# Patient Record
Sex: Male | Born: 1940 | Race: Black or African American | Hispanic: No | Marital: Single
Health system: Southern US, Community
[De-identification: ages and names within clinical notes are randomized; demographics above are authoritative.]

---

## 2017-04-18 ENCOUNTER — Inpatient Hospital Stay
Admission: AC | Admit: 2017-04-18 | Discharge: 2017-05-06 | Disposition: A | Discharge status: Skilled Nursing Facility | Payer: Self-pay | Source: Other Acute Inpatient Hospital | Attending: Internal Medicine | Admitting: Internal Medicine

## 2017-04-18 DIAGNOSIS — J90 Pleural effusion, not elsewhere classified: Secondary | ICD-10-CM

## 2017-04-18 DIAGNOSIS — J9690 Respiratory failure, unspecified, unspecified whether with hypoxia or hypercapnia: Secondary | ICD-10-CM

## 2017-04-18 DIAGNOSIS — Z9889 Other specified postprocedural states: Secondary | ICD-10-CM

## 2017-04-18 LAB — VANCOMYCIN, TROUGH: VANCOMYCIN TR: 23 ug/mL — AB (ref 15–20)

## 2017-04-19 ENCOUNTER — Other Ambulatory Visit (HOSPITAL_COMMUNITY): Payer: Self-pay

## 2017-04-19 LAB — CBC WITH DIFFERENTIAL/PLATELET
BASOS ABS: 0 10*3/uL (ref 0.0–0.1)
BASOS PCT: 0 %
Eosinophils Absolute: 0.3 10*3/uL (ref 0.0–0.7)
Eosinophils Relative: 3 %
HEMATOCRIT: 29.9 % — AB (ref 39.0–52.0)
HEMOGLOBIN: 9.4 g/dL — AB (ref 13.0–17.0)
LYMPHS PCT: 19 %
Lymphs Abs: 1.7 10*3/uL (ref 0.7–4.0)
MCH: 29.9 pg (ref 26.0–34.0)
MCHC: 31.4 g/dL (ref 30.0–36.0)
MCV: 95.2 fL (ref 78.0–100.0)
Monocytes Absolute: 1.2 10*3/uL — ABNORMAL HIGH (ref 0.1–1.0)
Monocytes Relative: 14 %
NEUTROS ABS: 5.7 10*3/uL (ref 1.7–7.7)
NEUTROS PCT: 64 %
Platelets: 326 10*3/uL (ref 150–400)
RBC: 3.14 MIL/uL — ABNORMAL LOW (ref 4.22–5.81)
RDW: 14.7 % (ref 11.5–15.5)
WBC: 8.9 10*3/uL (ref 4.0–10.5)

## 2017-04-19 LAB — COMPREHENSIVE METABOLIC PANEL
ALBUMIN: 1.5 g/dL — AB (ref 3.5–5.0)
ALT: 20 U/L (ref 17–63)
ANION GAP: 8 (ref 5–15)
AST: 47 U/L — ABNORMAL HIGH (ref 15–41)
Alkaline Phosphatase: 158 U/L — ABNORMAL HIGH (ref 38–126)
BILIRUBIN TOTAL: 1.1 mg/dL (ref 0.3–1.2)
BUN: 6 mg/dL (ref 6–20)
CALCIUM: 7.8 mg/dL — AB (ref 8.9–10.3)
CO2: 25 mmol/L (ref 22–32)
Chloride: 102 mmol/L (ref 101–111)
Creatinine, Ser: 0.86 mg/dL (ref 0.61–1.24)
GLUCOSE: 101 mg/dL — AB (ref 65–99)
POTASSIUM: 2.9 mmol/L — AB (ref 3.5–5.1)
Sodium: 135 mmol/L (ref 135–145)
TOTAL PROTEIN: 5.3 g/dL — AB (ref 6.5–8.1)

## 2017-04-19 LAB — POTASSIUM: Potassium: 3.8 mmol/L (ref 3.5–5.1)

## 2017-04-19 LAB — VANCOMYCIN, TROUGH: Vancomycin Tr: 11 ug/mL — ABNORMAL LOW (ref 15–20)

## 2017-04-19 LAB — PROTIME-INR
INR: 1.18
Prothrombin Time: 14.9 seconds (ref 11.4–15.2)

## 2017-04-22 LAB — VANCOMYCIN, TROUGH: VANCOMYCIN TR: 17 ug/mL (ref 15–20)

## 2017-04-23 LAB — CBC
HCT: 33.8 % — ABNORMAL LOW (ref 39.0–52.0)
HEMOGLOBIN: 10.8 g/dL — AB (ref 13.0–17.0)
MCH: 30.5 pg (ref 26.0–34.0)
MCHC: 32 g/dL (ref 30.0–36.0)
MCV: 95.5 fL (ref 78.0–100.0)
Platelets: 312 10*3/uL (ref 150–400)
RBC: 3.54 MIL/uL — ABNORMAL LOW (ref 4.22–5.81)
RDW: 14.6 % (ref 11.5–15.5)
WBC: 11.2 10*3/uL — ABNORMAL HIGH (ref 4.0–10.5)

## 2017-04-23 LAB — BASIC METABOLIC PANEL
ANION GAP: 11 (ref 5–15)
BUN: 7 mg/dL (ref 6–20)
CHLORIDE: 103 mmol/L (ref 101–111)
CO2: 24 mmol/L (ref 22–32)
Calcium: 8.1 mg/dL — ABNORMAL LOW (ref 8.9–10.3)
Creatinine, Ser: 0.97 mg/dL (ref 0.61–1.24)
GFR calc Af Amer: >60 mL/min (ref >60)
GLUCOSE: 94 mg/dL (ref 65–99)
POTASSIUM: 3.2 mmol/L — AB (ref 3.5–5.1)
Sodium: 138 mmol/L (ref 135–145)

## 2017-04-24 LAB — POTASSIUM: Potassium: 5.1 mmol/L (ref 3.5–5.1)

## 2017-04-25 LAB — VANCOMYCIN, TROUGH: Vancomycin Tr: 16 ug/mL (ref 15–20)

## 2017-04-25 LAB — POTASSIUM: Potassium: 3.1 mmol/L — ABNORMAL LOW (ref 3.5–5.1)

## 2017-04-26 LAB — COMPREHENSIVE METABOLIC PANEL
ALT: 14 U/L — ABNORMAL LOW (ref 17–63)
ANION GAP: 9 (ref 5–15)
AST: 30 U/L (ref 15–41)
Albumin: 2 g/dL — ABNORMAL LOW (ref 3.5–5.0)
Alkaline Phosphatase: 121 U/L (ref 38–126)
BILIRUBIN TOTAL: 1.2 mg/dL (ref 0.3–1.2)
BUN: 9 mg/dL (ref 6–20)
CHLORIDE: 104 mmol/L (ref 101–111)
CO2: 23 mmol/L (ref 22–32)
Calcium: 8.5 mg/dL — ABNORMAL LOW (ref 8.9–10.3)
Creatinine, Ser: 0.87 mg/dL (ref 0.61–1.24)
Glucose, Bld: 56 mg/dL — ABNORMAL LOW (ref 65–99)
POTASSIUM: 3.1 mmol/L — AB (ref 3.5–5.1)
Sodium: 136 mmol/L (ref 135–145)
Total Protein: 5.9 g/dL — ABNORMAL LOW (ref 6.5–8.1)

## 2017-04-26 LAB — CBC
HEMATOCRIT: 35.2 % — AB (ref 39.0–52.0)
Hemoglobin: 11.3 g/dL — ABNORMAL LOW (ref 13.0–17.0)
MCH: 30.6 pg (ref 26.0–34.0)
MCHC: 32.1 g/dL (ref 30.0–36.0)
MCV: 95.4 fL (ref 78.0–100.0)
PLATELETS: 323 10*3/uL (ref 150–400)
RBC: 3.69 MIL/uL — ABNORMAL LOW (ref 4.22–5.81)
RDW: 14.6 % (ref 11.5–15.5)
WBC: 10.5 10*3/uL (ref 4.0–10.5)

## 2017-04-26 LAB — MAGNESIUM: MAGNESIUM: 1.5 mg/dL — AB (ref 1.7–2.4)

## 2017-04-26 LAB — PHOSPHORUS: PHOSPHORUS: 3.6 mg/dL (ref 2.5–4.6)

## 2017-04-27 ENCOUNTER — Encounter: Payer: Self-pay | Admitting: *Deleted

## 2017-04-27 ENCOUNTER — Other Ambulatory Visit (HOSPITAL_COMMUNITY): Payer: Self-pay

## 2017-04-27 LAB — BASIC METABOLIC PANEL
Anion gap: 12 (ref 5–15)
BUN: 15 mg/dL (ref 6–20)
CO2: 21 mmol/L — ABNORMAL LOW (ref 22–32)
CREATININE: 1.2 mg/dL (ref 0.61–1.24)
Calcium: 8.7 mg/dL — ABNORMAL LOW (ref 8.9–10.3)
Chloride: 104 mmol/L (ref 101–111)
GFR calc Af Amer: >60 mL/min (ref >60)
GFR, EST NON AFRICAN AMERICAN: 58 mL/min — AB (ref >60)
GLUCOSE: 87 mg/dL (ref 65–99)
POTASSIUM: 3.7 mmol/L (ref 3.5–5.1)
SODIUM: 137 mmol/L (ref 135–145)

## 2017-04-27 LAB — MAGNESIUM: Magnesium: 1.9 mg/dL (ref 1.7–2.4)

## 2017-04-27 LAB — PHOSPHORUS: Phosphorus: 3.8 mg/dL (ref 2.5–4.6)

## 2017-04-28 ENCOUNTER — Other Ambulatory Visit (HOSPITAL_COMMUNITY): Payer: Self-pay

## 2017-04-28 LAB — GLUCOSE, PLEURAL OR PERITONEAL FLUID: Glucose, Fluid: 79 mg/dL

## 2017-04-28 LAB — GRAM STAIN

## 2017-04-28 MED ORDER — LIDOCAINE HCL (PF) 1 % IJ SOLN
INTRAMUSCULAR | Status: AC
  Filled 2017-04-28: qty 30

## 2017-04-28 NOTE — Procedures (Signed)
Ultrasound-guided diagnostic and therapeutic left thoracentesis performed yielding 0.4 liters of serous colored fluid. No immediate complications. Follow-up chest x-ray pending.      Letha CapeKelly E Chauncey Bruno 10:23 AM 04/28/2017

## 2017-04-28 NOTE — Progress Notes (Signed)
Referring Physician(s): Dr. Carron Curie  Supervising Physician: Malachy Moan  Patient Status: [facility]  Chief Complaint: Lung abscess drains placed at outside hospital   Subjective: This is a 77 yo male who was transferred to [facility] after being hospitalized at an outside facility for a lung abscess.  He had two pigtail drains placed.  I don't know any further history.  He is currently now here in [facility] and a CT of the chest yesterday was completed.  A request was made for possible drain manipulations.  Allergies: Patient has no allergy information on record.  Medications: Prior to Admission medications   Not on File    Vital Signs: BP (!) 122/58 (BP Location: Left Arm)   Physical Exam: Chest: chest drains in place with no output for the last 3 days.  These drains are hooked up to an "express" system.  Chest drains removed with no issues and site covered with clean dressing.  Imaging: Ct Chest Wo Contrast  Result Date: 04/27/2017 CLINICAL DATA:  Follow-up lung abscess drainage. EXAM: CT CHEST WITHOUT CONTRAST TECHNIQUE: Multidetector CT imaging of the chest was performed following the standard protocol without IV contrast. COMPARISON:  No prior cross-sectional imaging available for comparison. FINDINGS: Cardiovascular: There is moderate cardiac enlargement. Aortic atherosclerosis. Calcification within the LAD and left circumflex and RCA coronary arteries noted. No pericardial effusion. Mediastinum/Nodes: The trachea appears patent and midline. Unremarkable appearance of the esophagus. AP window lymph node measures 8 mm. Lungs/Pleura: A moderate volume left pleural effusion is identified. Mild to moderate changes of emphysema. Left upper lobe cavitary lung mass containing 2 percutaneous pigtail drainage catheters measures 5.1 by 6.4 by 7.5 cm (volume = 130 cm^3). Scattered inflammatory nodules are identified throughout the left lower lobe. Patchy airspace  consolidation within the right lung base noted. Upper Abdomen: Within the limitations of unenhanced technique no acute findings identified within the visualized portions of the upper abdomen. 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 No aggressive lytic or sclerotic bone lesions identified. IMPRESSION: 1. Cavitary lung mass within the left upper lobe compatible with abscess has a volume of approximately 130 cc and contains 2 percutaneous pigtail drainage catheters. 2. Moderate left pleural effusion. 3. Patchy airspace disease noted within the posterior right lung base. 4. Aortic atherosclerosis and 3 vessel coronary artery calcifications. 5.  Emphysema (ICD10-J43.9). Electronically Signed   By: Signa Kell M.D.   On: 04/27/2017 12:34   Dg Chest Port 1 View  Result Date: 04/28/2017 CLINICAL DATA:  77 year old male with cavitary left lung lesion status post ultrasound-guided left side thoracentesis this morning. EXAM: PORTABLE CHEST 1 VIEW COMPARISON:  Ultrasound thoracentesis images 0943 hr today. Chest CT without contrast 04/27/2017, and earlier. FINDINGS: Portable AP semi upright view at 1021 hrs. Percutaneous pigtail left chest tubes have been removed. Improved lower left lung ventilation status post thoracentesis. No pneumothorax identified. Continued mixed density and cavitary area about the left hilum. Continued left lung volume loss. Residual or loculated small volume pleural fluid in the left apex. The right lung remains clear. Stable left PICC line. Visualized tracheal air column is within normal limits. IMPRESSION: 1. Improved left lung ventilation status post thoracentesis with no pneumothorax. Percutaneous left chest pigtail drains removed. 2. Persistent cavitary lesion about the left hilum and left lung volume loss. Small volume persistent or loculated left apical pleural fluid. 3. No new cardiopulmonary abnormality. Electronically Signed   By: Odessa Fleming M.D.   On:  04/28/2017 10:47   [place] Thoracentesis Asp Pleural Space W/img Guide  Result Date: 04/28/2017 INDICATION: History of left lung abscess and bronchopleural fistula. New imaging reveals a small left pleural effusion. Request is made for diagnostic and therapeutic thoracentesis. EXAM: ULTRASOUND GUIDED DIAGNOSTIC AND THERAPEUTIC THORACENTESIS MEDICATIONS: 1% lidocaine COMPLICATIONS: None immediate. PROCEDURE: An ultrasound guided thoracentesis was thoroughly discussed with the patient and questions answered. The benefits, risks, alternatives and complications were also discussed. The patient understands and wishes to proceed with the procedure. Written consent was obtained. Ultrasound was performed to localize and mark an adequate pocket of fluid in the left chest. The area was then prepped and draped in the normal sterile fashion. 1% Lidocaine was used for local anesthesia. Under ultrasound guidance a Safe-T-Centesis catheter was introduced. Thoracentesis was performed. The catheter was removed and a dressing applied. FINDINGS: A total of approximately 0.4 L of serous fluid was removed. Samples were sent to the laboratory as requested by the clinical team. IMPRESSION: Successful ultrasound guided left thoracentesis yielding 0.4 L of pleural fluid. Read by: Barnetta ChapelKelly Roan Miklos, PA-C Electronically Signed   By: Malachy MoanHeath  McCullough M.D.   On: 04/28/2017 10:48    Labs:  CBC: Recent Labs    04/19/17 0352 04/23/17 0636 04/26/17 0600  WBC 8.9 11.2* 10.5  HGB 9.4* 10.8* 11.3*  HCT 29.9* 33.8* 35.2*  PLT 326 312 323    COAGS: Recent Labs    04/19/17 0352  INR 1.18    BMP: Recent Labs    04/19/17 0352  04/23/17 0636 04/24/17 0931 04/25/17 0641 04/26/17 0600 04/27/17 0743  NA 135  --  138  --   --  136 137  K 2.9*   < > 3.2* 5.1 3.1* 3.1* 3.7  CL 102  --  103  --   --  104 104  CO2 25  --  24  --   --  23 21*  GLUCOSE 101*  --  94  --   --  56* 87  BUN 6  --  7  --   --  9 15    CALCIUM 7.8*  --  8.1*  --   --  8.5* 8.7*  CREATININE 0.86  --  0.97  --   --  0.87 1.20  GFRNONAA >60  --  >60  --   --  >60 58*  GFRAA >60  --  >60  --   --  >60 >60   < > = values in this interval not displayed.    LIVER FUNCTION TESTS: Recent Labs    04/19/17 0352 04/26/17 0600  BILITOT 1.1 1.2  AST 47* 30  ALT 20 14*  ALKPHOS 158* 121  PROT 5.3* 5.9*  ALBUMIN 1.5* 2.0*    Assessment and Plan: 1. Lung abscess, s/p perc drain at outside hospital  Imaging reviewed with Dr. Archer AsaMcCullough.  There is no new abscess.  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  No repositioning of these drains is warranted.  Actually both drains can be pulled if no output and not hooked up to a [place] on suction.  Given these were not to suction and no output, they were removed.  This was thoroughly discussed with Merril Abbehun Li, NP in [facility] who desired these to be removed.  No further needs from IR service.   Electronically Signed: Letha CapeKelly E Richard Ritchey 04/28/2017, 11:37 AM   I spent a total of 25 Minutes at the the patient's bedside AND  on the patient's hospital floor or unit, greater than 50% of which was counseling/coordinating care for lung abscess

## 2017-04-29 ENCOUNTER — Other Ambulatory Visit (HOSPITAL_COMMUNITY): Payer: Self-pay

## 2017-04-29 LAB — CBC
HCT: 29.6 % — ABNORMAL LOW (ref 39.0–52.0)
HEMOGLOBIN: 9.6 g/dL — AB (ref 13.0–17.0)
MCH: 31.2 pg (ref 26.0–34.0)
MCHC: 32.4 g/dL (ref 30.0–36.0)
MCV: 96.1 fL (ref 78.0–100.0)
Platelets: 262 10*3/uL (ref 150–400)
RBC: 3.08 MIL/uL — AB (ref 4.22–5.81)
RDW: 15 % (ref 11.5–15.5)
WBC: 7.8 10*3/uL (ref 4.0–10.5)

## 2017-04-29 LAB — BASIC METABOLIC PANEL
Anion gap: 9 (ref 5–15)
BUN: 17 mg/dL (ref 6–20)
CHLORIDE: 105 mmol/L (ref 101–111)
CO2: 20 mmol/L — ABNORMAL LOW (ref 22–32)
Calcium: 8.4 mg/dL — ABNORMAL LOW (ref 8.9–10.3)
Creatinine, Ser: 1.34 mg/dL — ABNORMAL HIGH (ref 0.61–1.24)
GFR calc non Af Amer: 51 mL/min — ABNORMAL LOW (ref >60)
GFR, EST AFRICAN AMERICAN: 59 mL/min — AB (ref >60)
Glucose, Bld: 89 mg/dL (ref 65–99)
POTASSIUM: 3.3 mmol/L — AB (ref 3.5–5.1)
SODIUM: 134 mmol/L — AB (ref 135–145)

## 2017-04-29 LAB — MAGNESIUM: MAGNESIUM: 1.7 mg/dL (ref 1.7–2.4)

## 2017-04-29 LAB — PHOSPHORUS: PHOSPHORUS: 3.8 mg/dL (ref 2.5–4.6)

## 2017-04-30 ENCOUNTER — Other Ambulatory Visit (HOSPITAL_COMMUNITY): Payer: Self-pay

## 2017-04-30 LAB — POTASSIUM: Potassium: 3.4 mmol/L — ABNORMAL LOW (ref 3.5–5.1)

## 2017-05-01 ENCOUNTER — Other Ambulatory Visit (HOSPITAL_COMMUNITY): Payer: Self-pay

## 2017-05-01 LAB — CBC
HCT: 31 % — ABNORMAL LOW (ref 39.0–52.0)
HEMOGLOBIN: 9.8 g/dL — AB (ref 13.0–17.0)
MCH: 30 pg (ref 26.0–34.0)
MCHC: 31.6 g/dL (ref 30.0–36.0)
MCV: 94.8 fL (ref 78.0–100.0)
PLATELETS: 292 10*3/uL (ref 150–400)
RBC: 3.27 MIL/uL — ABNORMAL LOW (ref 4.22–5.81)
RDW: 14.5 % (ref 11.5–15.5)
WBC: 9.1 10*3/uL (ref 4.0–10.5)

## 2017-05-01 LAB — URINALYSIS, ROUTINE W REFLEX MICROSCOPIC
Bilirubin Urine: NEGATIVE
Glucose, UA: NEGATIVE mg/dL
KETONES UR: NEGATIVE mg/dL
Leukocytes, UA: NEGATIVE
NITRITE: POSITIVE — AB
PROTEIN: NEGATIVE mg/dL
Specific Gravity, Urine: 1.008 (ref 1.005–1.030)
pH: 8 (ref 5.0–8.0)

## 2017-05-01 LAB — EXPECTORATED SPUTUM ASSESSMENT W GRAM STAIN, RFLX TO RESP C

## 2017-05-01 LAB — BASIC METABOLIC PANEL
Anion gap: 8 (ref 5–15)
BUN: 20 mg/dL (ref 6–20)
CHLORIDE: 104 mmol/L (ref 101–111)
CO2: 21 mmol/L — ABNORMAL LOW (ref 22–32)
CREATININE: 1.36 mg/dL — AB (ref 0.61–1.24)
Calcium: 8.4 mg/dL — ABNORMAL LOW (ref 8.9–10.3)
GFR calc Af Amer: 58 mL/min — ABNORMAL LOW (ref >60)
GFR calc non Af Amer: 50 mL/min — ABNORMAL LOW (ref >60)
Glucose, Bld: 46 mg/dL — ABNORMAL LOW (ref 65–99)
Potassium: 3.6 mmol/L (ref 3.5–5.1)
SODIUM: 133 mmol/L — AB (ref 135–145)

## 2017-05-01 LAB — EXPECTORATED SPUTUM ASSESSMENT W REFEX TO RESP CULTURE

## 2017-05-01 LAB — VANCOMYCIN, TROUGH: Vancomycin Tr: 50 ug/mL (ref 15–20)

## 2017-05-01 LAB — MAGNESIUM: MAGNESIUM: 1.7 mg/dL (ref 1.7–2.4)

## 2017-05-01 LAB — PHOSPHORUS: Phosphorus: 3.9 mg/dL (ref 2.5–4.6)

## 2017-05-02 LAB — CBC
HEMATOCRIT: 33.9 % — AB (ref 39.0–52.0)
Hemoglobin: 11 g/dL — ABNORMAL LOW (ref 13.0–17.0)
MCH: 31.2 pg (ref 26.0–34.0)
MCHC: 32.4 g/dL (ref 30.0–36.0)
MCV: 96 fL (ref 78.0–100.0)
Platelets: 306 10*3/uL (ref 150–400)
RBC: 3.53 MIL/uL — AB (ref 4.22–5.81)
RDW: 15 % (ref 11.5–15.5)
WBC: 9.6 10*3/uL (ref 4.0–10.5)

## 2017-05-02 LAB — MAGNESIUM: MAGNESIUM: 1.7 mg/dL (ref 1.7–2.4)

## 2017-05-02 LAB — RENAL FUNCTION PANEL
Albumin: 2.2 g/dL — ABNORMAL LOW (ref 3.5–5.0)
Anion gap: 11 (ref 5–15)
BUN: 18 mg/dL (ref 6–20)
CO2: 22 mmol/L (ref 22–32)
Calcium: 8.8 mg/dL — ABNORMAL LOW (ref 8.9–10.3)
Chloride: 102 mmol/L (ref 101–111)
Creatinine, Ser: 1.4 mg/dL — ABNORMAL HIGH (ref 0.61–1.24)
GFR, EST AFRICAN AMERICAN: 56 mL/min — AB (ref >60)
GFR, EST NON AFRICAN AMERICAN: 48 mL/min — AB (ref >60)
Glucose, Bld: 73 mg/dL (ref 65–99)
PHOSPHORUS: 3.7 mg/dL (ref 2.5–4.6)
POTASSIUM: 3.3 mmol/L — AB (ref 3.5–5.1)
Sodium: 135 mmol/L (ref 135–145)

## 2017-05-02 LAB — URINE CULTURE

## 2017-05-02 LAB — VANCOMYCIN, TROUGH: VANCOMYCIN TR: 26 ug/mL — AB (ref 15–20)

## 2017-05-03 LAB — CULTURE, BODY FLUID-BOTTLE: CULTURE: NO GROWTH

## 2017-05-03 LAB — CULTURE, RESPIRATORY W GRAM STAIN

## 2017-05-03 LAB — CULTURE, RESPIRATORY: CULTURE: NORMAL

## 2017-05-03 LAB — CULTURE, BODY FLUID W GRAM STAIN -BOTTLE

## 2017-05-03 LAB — MAGNESIUM: Magnesium: 1.8 mg/dL (ref 1.7–2.4)

## 2017-05-03 LAB — POTASSIUM: Potassium: 3.6 mmol/L (ref 3.5–5.1)

## 2017-05-05 LAB — BASIC METABOLIC PANEL
Anion gap: 9 (ref 5–15)
BUN: 13 mg/dL (ref 6–20)
CALCIUM: 8.5 mg/dL — AB (ref 8.9–10.3)
CHLORIDE: 103 mmol/L (ref 101–111)
CO2: 24 mmol/L (ref 22–32)
CREATININE: 1.36 mg/dL — AB (ref 0.61–1.24)
GFR, EST AFRICAN AMERICAN: 58 mL/min — AB (ref >60)
GFR, EST NON AFRICAN AMERICAN: 50 mL/min — AB (ref >60)
Glucose, Bld: 46 mg/dL — ABNORMAL LOW (ref 65–99)
Potassium: 3.2 mmol/L — ABNORMAL LOW (ref 3.5–5.1)
Sodium: 136 mmol/L (ref 135–145)

## 2017-05-06 LAB — VANCOMYCIN, TROUGH: Vancomycin Tr: 7 ug/mL — ABNORMAL LOW (ref 15–20)

## 2017-05-06 LAB — CULTURE, BLOOD (ROUTINE X 2)
Culture: NO GROWTH
Culture: NO GROWTH
SPECIAL REQUESTS: ADEQUATE
Special Requests: ADEQUATE

## 2017-05-06 LAB — BASIC METABOLIC PANEL
ANION GAP: 9 (ref 5–15)
BUN: 15 mg/dL (ref 6–20)
CALCIUM: 8.5 mg/dL — AB (ref 8.9–10.3)
CO2: 22 mmol/L (ref 22–32)
Chloride: 104 mmol/L (ref 101–111)
Creatinine, Ser: 1.47 mg/dL — ABNORMAL HIGH (ref 0.61–1.24)
GFR calc Af Amer: 52 mL/min — ABNORMAL LOW (ref >60)
GFR, EST NON AFRICAN AMERICAN: 45 mL/min — AB (ref >60)
GLUCOSE: 93 mg/dL (ref 65–99)
POTASSIUM: 3.8 mmol/L (ref 3.5–5.1)
SODIUM: 135 mmol/L (ref 135–145)

## 2019-07-16 IMAGING — DX DG CHEST 1V PORT
1 series · 1 of 1 positions shown · non-contrast
Comparison: None.

CLINICAL DATA: 76-year-old male with a history of pneumonia

EXAM:
PORTABLE CHEST 1 VIEW

[chest ap]
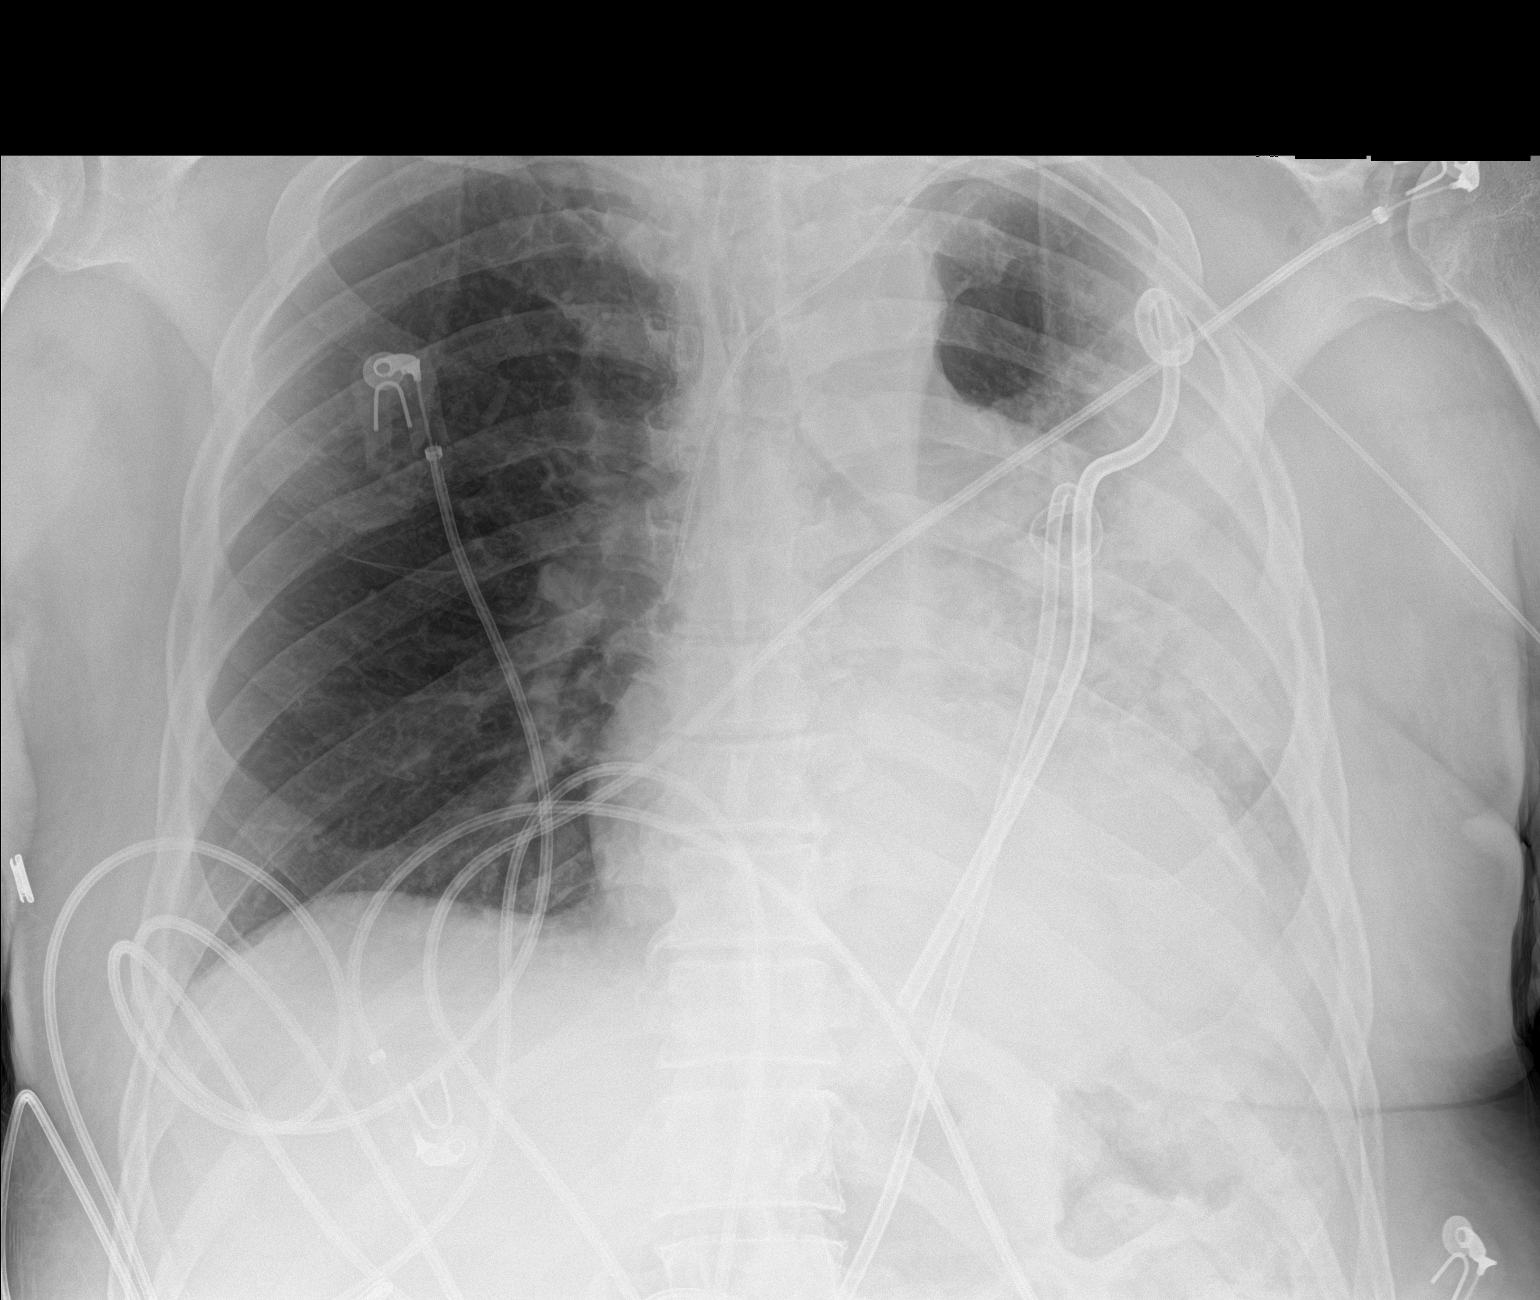

[1 of 1 positions shown; findings below may reference images not displayed]

FINDINGS: Cardiac silhouette obscured by overlying lung and pleural disease.

Dense opacity on the left obscures the left heart border, left
hemidiaphragm, with only minimal maintained aeration at the left
apex.

Two drain catheters project over the left hemithorax.

Right lung relatively well aerated.

Left upper extremity PICC, with the tip appearing to terminate
superior vena cava.
IMPRESSION: Frontal chest x-ray demonstrates dense opacity of the left chest,
likely a combination of pleural fluid/empyema, lung consolidation,
and atelectasis.

Two drainage catheters project over the left chest.

Left upper extremity PICC.
